# Patient Record
Sex: Female | Born: 1995 | Race: White | Hispanic: No | Marital: Single | Smoking: Current every day smoker
Health system: Southern US, Community
[De-identification: ages and names within clinical notes are randomized; demographics above are authoritative.]

## PROBLEM LIST (undated history)

## (undated) ENCOUNTER — Ambulatory Visit (HOSPITAL_COMMUNITY): Discharge status: Absent without leave | Payer: No Typology Code available for payment source

## (undated) HISTORY — PX: ANKLE SURGERY: SHX546

## (undated) HISTORY — PX: TONSILLECTOMY: SUR1361

---
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
## 2013-10-03 ENCOUNTER — Emergency Department (HOSPITAL_COMMUNITY)
Admission: EM | Admit: 2013-10-03 | Discharge: 2013-10-03 | Disposition: A | Discharge status: Home / Self Care | Payer: Medicaid Other | Attending: Emergency Medicine | Admitting: Emergency Medicine

## 2013-10-03 ENCOUNTER — Emergency Department (HOSPITAL_COMMUNITY): Payer: Medicaid Other

## 2013-10-03 ENCOUNTER — Encounter (HOSPITAL_COMMUNITY): Payer: Self-pay | Admitting: Emergency Medicine

## 2013-10-03 DIAGNOSIS — S161XXA Strain of muscle, fascia and tendon at neck level, initial encounter: Secondary | ICD-10-CM

## 2013-10-03 DIAGNOSIS — Y9389 Activity, other specified: Secondary | ICD-10-CM | POA: Insufficient documentation

## 2013-10-03 DIAGNOSIS — R55 Syncope and collapse: Secondary | ICD-10-CM | POA: Insufficient documentation

## 2013-10-03 DIAGNOSIS — F17200 Nicotine dependence, unspecified, uncomplicated: Secondary | ICD-10-CM | POA: Diagnosis not present

## 2013-10-03 DIAGNOSIS — Y92410 Unspecified street and highway as the place of occurrence of the external cause: Secondary | ICD-10-CM | POA: Insufficient documentation

## 2013-10-03 DIAGNOSIS — S139XXA Sprain of joints and ligaments of unspecified parts of neck, initial encounter: Secondary | ICD-10-CM | POA: Insufficient documentation

## 2013-10-03 DIAGNOSIS — IMO0002 Reserved for concepts with insufficient information to code with codable children: Secondary | ICD-10-CM | POA: Diagnosis present

## 2013-10-03 DIAGNOSIS — S060X1A Concussion with loss of consciousness of 30 minutes or less, initial encounter: Secondary | ICD-10-CM | POA: Diagnosis not present

## 2013-10-03 DIAGNOSIS — Z79899 Other long term (current) drug therapy: Secondary | ICD-10-CM | POA: Insufficient documentation

## 2013-10-03 DIAGNOSIS — S00411A Abrasion of right ear, initial encounter: Secondary | ICD-10-CM

## 2013-10-03 MED ORDER — OXYCODONE-ACETAMINOPHEN 5-325 MG PO TABS: 1.0000 | ORAL_TABLET | ORAL | Status: DC | PRN

## 2013-10-03 MED ORDER — TETANUS-DIPHTH-ACELL PERTUSSIS 5-2.5-18.5 LF-MCG/0.5 IM SUSP: 0.5000 mL | Freq: Once | INTRAMUSCULAR | Status: DC

## 2013-10-03 MED ORDER — OXYCODONE-ACETAMINOPHEN 5-325 MG PO TABS
2.0000 | ORAL_TABLET | Freq: Once | ORAL | Status: AC
  Administered 2013-10-03: 2 via ORAL
  Filled 2013-10-03: qty 2

## 2013-10-03 MED ORDER — MORPHINE SULFATE 4 MG/ML IJ SOLN
4.0000 mg | Freq: Once | INTRAMUSCULAR | Status: AC
  Administered 2013-10-03: 4 mg via INTRAMUSCULAR
  Filled 2013-10-03: qty 1
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
## 2013-10-03 NOTE — ED Notes (Signed)
Right ear cleaned. No lac found, pt has abrasions. EDP aware

## 2013-10-03 NOTE — Discharge Instructions (Signed)
You have neck pain, possibly from a cervical strain and/or pinched nerve.  ° °SEEK IMMEDIATE MEDICAL ATTENTION IF: °You develop difficulties swallowing or breathing.  °You have new or worse numbness, weakness, tingling, or movement problems in your arms or legs.  °You develop increasing pain which is uncontrolled with medications.  °You have change in bowel or bladder function, or other concerns. ° °You have had a head injury which does not appear to require admission at this time. A concussion is a state of changed mental ability from trauma. ° °SEEK IMMEDIATE MEDICAL ATTENTION IF: °There is confusion or drowsiness (although children frequently become drowsy after injury).  °You cannot awaken the injured person.  °There is nausea (feeling sick to your stomach) or continued, forceful vomiting.  °You notice dizziness or unsteadiness which is getting worse, or inability to walk.  °You have convulsions or unconsciousness.  °You experience severe, persistent headaches not relieved by Tylenol. (Do not take aspirin as this impairs clotting abilities). Take other pain medications only as directed.  °You cannot use arms or legs normally.  °There are changes in pupil sizes. (This is the black center in the colored part of the eye)  °There is clear or bloody discharge from the nose or ears.  °Change in speech, vision, swallowing, or understanding.  °Localized weakness, numbness, tingling, or change in bowel or bladder control. ° °

## 2013-10-12 ENCOUNTER — Encounter (HOSPITAL_COMMUNITY): Payer: Self-pay | Admitting: Emergency Medicine

## 2013-10-12 ENCOUNTER — Emergency Department (HOSPITAL_COMMUNITY)
Admission: EM | Admit: 2013-10-12 | Discharge: 2013-10-12 | Disposition: A | Discharge status: Home / Self Care | Payer: Medicaid Other | Attending: Emergency Medicine | Admitting: Emergency Medicine

## 2013-10-12 DIAGNOSIS — Z79899 Other long term (current) drug therapy: Secondary | ICD-10-CM | POA: Insufficient documentation

## 2013-10-12 DIAGNOSIS — R51 Headache: Secondary | ICD-10-CM | POA: Diagnosis present

## 2013-10-12 DIAGNOSIS — F0781 Postconcussional syndrome: Secondary | ICD-10-CM | POA: Diagnosis not present

## 2013-10-12 DIAGNOSIS — F17200 Nicotine dependence, unspecified, uncomplicated: Secondary | ICD-10-CM | POA: Insufficient documentation

## 2013-10-12 DIAGNOSIS — H9319 Tinnitus, unspecified ear: Secondary | ICD-10-CM | POA: Insufficient documentation

## 2013-10-12 MED ORDER — OXYCODONE-ACETAMINOPHEN 5-325 MG PO TABS: 1.0000 | ORAL_TABLET | ORAL | Status: AC | PRN

## 2013-10-12 MED ORDER — CYCLOBENZAPRINE HCL 5 MG PO TABS: 5.0000 mg | ORAL_TABLET | Freq: Three times a day (TID) | ORAL | Status: AC | PRN

## 2013-10-12 MED ORDER — IBUPROFEN 600 MG PO TABS: 600.0000 mg | ORAL_TABLET | Freq: Four times a day (QID) | ORAL | Status: AC | PRN
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
## 2013-10-12 NOTE — Discharge Instructions (Signed)
Concussion °A concussion, or closed-head injury, is a brain injury caused by a direct blow to the head or by a quick and sudden movement (jolt) of the head or neck. Concussions are usually not life-threatening. Even so, the effects of a concussion can be serious. If you have had a concussion before, you are more likely to experience concussion-like symptoms after a direct blow to the head.  °CAUSES °· Direct blow to the head, such as from running into another player during a soccer game, being hit in a fight, or hitting your head on a hard surface. °· A jolt of the head or neck that causes the brain to move back and forth inside the skull, such as in a car crash. °SIGNS AND SYMPTOMS °The signs of a concussion can be hard to notice. Early on, they may be missed by you, family members, and health care providers. You may look fine but act or feel differently. °Symptoms are usually temporary, but they may last for days, weeks, or even longer. Some symptoms may appear right away while others may not show up for hours or days. Every head injury is different. Symptoms include: °· Mild to moderate headaches that will not go away. °· A feeling of pressure inside your head. °· Having more trouble than usual: °¨ Learning or remembering things you have heard. °¨ Answering questions. °¨ Paying attention or concentrating. °¨ Organizing daily tasks. °¨ Making decisions and solving problems. °· Slowness in thinking, acting or reacting, speaking, or reading. °· Getting lost or being easily confused. °· Feeling tired all the time or lacking energy (fatigued). °· Feeling drowsy. °· Sleep disturbances. °¨ Sleeping more than usual. °¨ Sleeping less than usual. °¨ Trouble falling asleep. °¨ Trouble sleeping (insomnia). °· Loss of balance or feeling lightheaded or dizzy. °· Nausea or vomiting. °· Numbness or tingling. °· Increased sensitivity to: °¨ Sounds. °¨ Lights. °¨ Distractions. °· Vision problems or eyes that tire  easily. °· Diminished sense of taste or smell. °· Ringing in the ears. °· Mood changes such as feeling sad or anxious. °· Becoming easily irritated or angry for little or no reason. °· Lack of motivation. °· Seeing or hearing things other people do not see or hear (hallucinations). °DIAGNOSIS °Your health care provider can usually diagnose a concussion based on a description of your injury and symptoms. He or she will ask whether you passed out (lost consciousness) and whether you are having trouble remembering events that happened right before and during your injury. °Your evaluation might include: °· A brain scan to look for signs of injury to the brain. Even if the test shows no injury, you may still have a concussion. °· Blood tests to be sure other problems are not present. °TREATMENT °· Concussions are usually treated in an emergency department, in urgent care, or at a clinic. You may need to stay in the hospital overnight for further treatment. °· Tell your health care provider if you are taking any medicines, including prescription medicines, over-the-counter medicines, and natural remedies. Some medicines, such as blood thinners (anticoagulants) and aspirin, may increase the chance of complications. Also tell your health care provider whether you have had alcohol or are taking illegal drugs. This information may affect treatment. °· Your health care provider will send you home with important instructions to follow. °· How fast you will recover from a concussion depends on many factors. These factors include how severe your concussion is, what part of your brain was injured, your   age, and how healthy you were before the concussion. °· Most people with mild injuries recover fully. Recovery can take time. In general, recovery is slower in older persons. Also, persons who have had a concussion in the past or have other medical problems may find that it takes longer to recover from their current injury. °HOME  CARE INSTRUCTIONS °General Instructions °· Carefully follow the directions your health care provider gave you. °· Only take over-the-counter or prescription medicines for pain, discomfort, or fever as directed by your health care provider. °· Take only those medicines that your health care provider has approved. °· Do not drink alcohol until your health care provider says you are well enough to do so. Alcohol and certain other drugs may slow your recovery and can put you at risk of further injury. °· If it is harder than usual to remember things, write them down. °· If you are easily distracted, try to do one thing at a time. For example, do not try to watch TV while fixing dinner. °· Talk with family members or close friends when making important decisions. °· Keep all follow-up appointments. Repeated evaluation of your symptoms is recommended for your recovery. °· Watch your symptoms and tell others to do the same. Complications sometimes occur after a concussion. Older adults with a brain injury may have a higher risk of serious complications, such as a blood clot on the brain. °· Tell your teachers, school nurse, school counselor, coach, athletic trainer, or work manager about your injury, symptoms, and restrictions. Tell them about what you can or cannot do. They should watch for: °¨ Increased problems with attention or concentration. °¨ Increased difficulty remembering or learning new information. °¨ Increased time needed to complete tasks or assignments. °¨ Increased irritability or decreased ability to cope with stress. °¨ Increased symptoms. °· Rest. Rest helps the brain to heal. Make sure you: °¨ Get plenty of sleep at night. Avoid staying up late at night. °¨ Keep the same bedtime hours on weekends and weekdays. °¨ Rest during the day. Take daytime naps or rest breaks when you feel tired. °· Limit activities that require a lot of thought or concentration. These include: °¨ Doing homework or job-related  work. °¨ Watching TV. °¨ Working on the computer. °· Avoid any situation where there is potential for another head injury (football, hockey, soccer, basketball, martial arts, downhill snow sports and horseback riding). Your condition will get worse every time you experience a concussion. You should avoid these activities until you are evaluated by the appropriate follow-up health care providers. °Returning To Your Regular Activities °You will need to return to your normal activities slowly, not all at once. You must give your body and brain enough time for recovery. °· Do not return to sports or other athletic activities until your health care provider tells you it is safe to do so. °· Ask your health care provider when you can drive, ride a bicycle, or operate heavy machinery. Your ability to react may be slower after a brain injury. Never do these activities if you are dizzy. °· Ask your health care provider about when you can return to work or school. °Preventing Another Concussion °It is very important to avoid another brain injury, especially before you have recovered. In rare cases, another injury can lead to permanent brain damage, brain swelling, or death. The risk of this is greatest during the first 7-10 days after a head injury. Avoid injuries by: °· Wearing a seat   belt when riding in a car. °· Drinking alcohol only in moderation. °· Wearing a helmet when biking, skiing, skateboarding, skating, or doing similar activities. °· Avoiding activities that could lead to a second concussion, such as contact or recreational sports, until your health care provider says it is okay. °· Taking safety measures in your home. °¨ Remove clutter and tripping hazards from floors and stairways. °¨ Use grab bars in bathrooms and handrails by stairs. °¨ Place non-slip mats on floors and in bathtubs. °¨ Improve lighting in dim areas. °SEEK MEDICAL CARE IF: °· You have increased problems paying attention or  concentrating. °· You have increased difficulty remembering or learning new information. °· You need more time to complete tasks or assignments than before. °· You have increased irritability or decreased ability to cope with stress. °· You have more symptoms than before. °Seek medical care if you have any of the following symptoms for more than 2 weeks after your injury: °· Lasting (chronic) headaches. °· Dizziness or balance problems. °· Nausea. °· Vision problems. °· Increased sensitivity to noise or light. °· Depression or mood swings. °· Anxiety or irritability. °· Memory problems. °· Difficulty concentrating or paying attention. °· Sleep problems. °· Feeling tired all the time. °SEEK IMMEDIATE MEDICAL CARE IF: °· You have severe or worsening headaches. These may be a sign of a blood clot in the brain. °· You have weakness (even if only in one hand, leg, or part of the face). °· You have numbness. °· You have decreased coordination. °· You vomit repeatedly. °· You have increased sleepiness. °· One pupil is larger than the other. °· You have convulsions. °· You have slurred speech. °· You have increased confusion. This may be a sign of a blood clot in the brain. °· You have increased restlessness, agitation, or irritability. °· You are unable to recognize people or places. °· You have neck pain. °· It is difficult to wake you up. °· You have unusual behavior changes. °· You lose consciousness. °MAKE SURE YOU: °· Understand these instructions. °· Will watch your condition. °· Will get help right away if you are not doing well or get worse. °Document Released: 06/13/2003 Document Revised: 03/28/2013 Document Reviewed: 10/13/2012 °[employer]® Patient Information ©2015 [employer]. This information is not intended to replace advice given to you by your health care provider. Make sure you discuss any questions you have with your health care provider. ° °Concussion °A concussion, or closed-head injury, is a brain  injury caused by a direct blow to the head or by a quick and sudden movement (jolt) of the head or neck. Concussions are usually not life-threatening. Even so, the effects of a concussion can be serious. If you have had a concussion before, you are more likely to experience concussion-like symptoms after a direct blow to the head.  °CAUSES °· Direct blow to the head, such as from running into another player during a soccer game, being hit in a fight, or hitting your head on a hard surface. °· A jolt of the head or neck that causes the brain to move back and forth inside the skull, such as in a car crash. °SIGNS AND SYMPTOMS °The signs of a concussion can be hard to notice. Early on, they may be missed by you, family members, and health care providers. You may look fine but act or feel differently. °Symptoms are usually temporary, but they may last for days, weeks, or even longer. Some symptoms may appear right   away while others may not show up for hours or days. Every head injury is different. Symptoms include: °· Mild to moderate headaches that will not go away. °· A feeling of pressure inside your head. °· Having more trouble than usual: °¨ Learning or remembering things you have heard. °¨ Answering questions. °¨ Paying attention or concentrating. °¨ Organizing daily tasks. °¨ Making decisions and solving problems. °· Slowness in thinking, acting or reacting, speaking, or reading. °· Getting lost or being easily confused. °· Feeling tired all the time or lacking energy (fatigued). °· Feeling drowsy. °· Sleep disturbances. °¨ Sleeping more than usual. °¨ Sleeping less than usual. °¨ Trouble falling asleep. °¨ Trouble sleeping (insomnia). °· Loss of balance or feeling lightheaded or dizzy. °· Nausea or vomiting. °· Numbness or tingling. °· Increased sensitivity to: °¨ Sounds. °¨ Lights. °¨ Distractions. °· Vision problems or eyes that tire easily. °· Diminished sense of taste or smell. °· Ringing in the  ears. °· Mood changes such as feeling sad or anxious. °· Becoming easily irritated or angry for little or no reason. °· Lack of motivation. °· Seeing or hearing things other people do not see or hear (hallucinations). °DIAGNOSIS °Your health care provider can usually diagnose a concussion based on a description of your injury and symptoms. He or she will ask whether you passed out (lost consciousness) and whether you are having trouble remembering events that happened right before and during your injury. °Your evaluation might include: °· A brain scan to look for signs of injury to the brain. Even if the test shows no injury, you may still have a concussion. °· Blood tests to be sure other problems are not present. °TREATMENT °· Concussions are usually treated in an emergency department, in urgent care, or at a clinic. You may need to stay in the hospital overnight for further treatment. °· Tell your health care provider if you are taking any medicines, including prescription medicines, over-the-counter medicines, and natural remedies. Some medicines, such as blood thinners (anticoagulants) and aspirin, may increase the chance of complications. Also tell your health care provider whether you have had alcohol or are taking illegal drugs. This information may affect treatment. °· Your health care provider will send you home with important instructions to follow. °· How fast you will recover from a concussion depends on many factors. These factors include how severe your concussion is, what part of your brain was injured, your age, and how healthy you were before the concussion. °· Most people with mild injuries recover fully. Recovery can take time. In general, recovery is slower in older persons. Also, persons who have had a concussion in the past or have other medical problems may find that it takes longer to recover from their current injury. °HOME CARE INSTRUCTIONS °General Instructions °· Carefully follow the  directions your health care provider gave you. °· Only take over-the-counter or prescription medicines for pain, discomfort, or fever as directed by your health care provider. °· Take only those medicines that your health care provider has approved. °· Do not drink alcohol until your health care provider says you are well enough to do so. Alcohol and certain other drugs may slow your recovery and can put you at risk of further injury. °· If it is harder than usual to remember things, write them down. °· If you are easily distracted, try to do one thing at a time. For example, do not try to watch TV while fixing dinner. °· Talk with   family members or close friends when making important decisions. °· Keep all follow-up appointments. Repeated evaluation of your symptoms is recommended for your recovery. °· Watch your symptoms and tell others to do the same. Complications sometimes occur after a concussion. Older adults with a brain injury may have a higher risk of serious complications, such as a blood clot on the brain. °· Tell your teachers, school nurse, school counselor, coach, athletic trainer, or work manager about your injury, symptoms, and restrictions. Tell them about what you can or cannot do. They should watch for: °¨ Increased problems with attention or concentration. °¨ Increased difficulty remembering or learning new information. °¨ Increased time needed to complete tasks or assignments. °¨ Increased irritability or decreased ability to cope with stress. °¨ Increased symptoms. °· Rest. Rest helps the brain to heal. Make sure you: °¨ Get plenty of sleep at night. Avoid staying up late at night. °¨ Keep the same bedtime hours on weekends and weekdays. °¨ Rest during the day. Take daytime naps or rest breaks when you feel tired. °· Limit activities that require a lot of thought or concentration. These include: °¨ Doing homework or job-related work. °¨ Watching TV. °¨ Working on the computer. °· Avoid any  situation where there is potential for another head injury (football, hockey, soccer, basketball, martial arts, downhill snow sports and horseback riding). Your condition will get worse every time you experience a concussion. You should avoid these activities until you are evaluated by the appropriate follow-up health care providers. °Returning To Your Regular Activities °You will need to return to your normal activities slowly, not all at once. You must give your body and brain enough time for recovery. °· Do not return to sports or other athletic activities until your health care provider tells you it is safe to do so. °· Ask your health care provider when you can drive, ride a bicycle, or operate heavy machinery. Your ability to react may be slower after a brain injury. Never do these activities if you are dizzy. °· Ask your health care provider about when you can return to work or school. °Preventing Another Concussion °It is very important to avoid another brain injury, especially before you have recovered. In rare cases, another injury can lead to permanent brain damage, brain swelling, or death. The risk of this is greatest during the first 7-10 days after a head injury. Avoid injuries by: °· Wearing a seat belt when riding in a car. °· Drinking alcohol only in moderation. °· Wearing a helmet when biking, skiing, skateboarding, skating, or doing similar activities. °· Avoiding activities that could lead to a second concussion, such as contact or recreational sports, until your health care provider says it is okay. °· Taking safety measures in your home. °¨ Remove clutter and tripping hazards from floors and stairways. °¨ Use grab bars in bathrooms and handrails by stairs. °¨ Place non-slip mats on floors and in bathtubs. °¨ Improve lighting in dim areas. °SEEK MEDICAL CARE IF: °· You have increased problems paying attention or concentrating. °· You have increased difficulty remembering or learning new  information. °· You need more time to complete tasks or assignments than before. °· You have increased irritability or decreased ability to cope with stress. °· You have more symptoms than before. °Seek medical care if you have any of the following symptoms for more than 2 weeks after your injury: °· Lasting (chronic) headaches. °· Dizziness or balance problems. °· Nausea. °· Vision problems. °·   Increased sensitivity to noise or light. °· Depression or mood swings. °· Anxiety or irritability. °· Memory problems. °· Difficulty concentrating or paying attention. °· Sleep problems. °· Feeling tired all the time. °SEEK IMMEDIATE MEDICAL CARE IF: °· You have severe or worsening headaches. These may be a sign of a blood clot in the brain. °· You have weakness (even if only in one hand, leg, or part of the face). °· You have numbness. °· You have decreased coordination. °· You vomit repeatedly. °· You have increased sleepiness. °· One pupil is larger than the other. °· You have convulsions. °· You have slurred speech. °· You have increased confusion. This may be a sign of a blood clot in the brain. °· You have increased restlessness, agitation, or irritability. °· You are unable to recognize people or places. °· You have neck pain. °· It is difficult to wake you up. °· You have unusual behavior changes. °· You lose consciousness. °MAKE SURE YOU: °· Understand these instructions. °· Will watch your condition. °· Will get help right away if you are not doing well or get worse. °Document Released: 06/13/2003 Document Revised: 03/28/2013 Document Reviewed: 10/13/2012 °[employer]® Patient Information ©2015 [employer]. This information is not intended to replace advice given to you by your health care provider. Make sure you discuss any questions you have with your health care provider. ° °
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
## 2013-10-15 NOTE — ED Provider Notes (Signed)
Medical screening examination/treatment/procedure(s) were performed by non-physician practitioner and as supervising physician I was immediately available for consultation/collaboration.   EKG Interpretation None       Pinki Rottman, MD 10/15/13 0729 

## 2013-11-01 ENCOUNTER — Ambulatory Visit: Payer: Medicaid Other | Admitting: Neurology

## 2014-04-17 ENCOUNTER — Encounter (HOSPITAL_COMMUNITY): Payer: Self-pay | Admitting: Cardiology

## 2014-04-17 ENCOUNTER — Emergency Department (HOSPITAL_COMMUNITY)
Admission: EM | Admit: 2014-04-17 | Discharge: 2014-04-17 | Disposition: A | Discharge status: Home / Self Care | Payer: Medicaid Other | Attending: Emergency Medicine | Admitting: Emergency Medicine

## 2014-04-17 ENCOUNTER — Emergency Department (HOSPITAL_COMMUNITY): Payer: Medicaid Other

## 2014-04-17 DIAGNOSIS — M542 Cervicalgia: Secondary | ICD-10-CM

## 2014-04-17 DIAGNOSIS — S199XXA Unspecified injury of neck, initial encounter: Secondary | ICD-10-CM | POA: Diagnosis present

## 2014-04-17 DIAGNOSIS — Y92410 Unspecified street and highway as the place of occurrence of the external cause: Secondary | ICD-10-CM | POA: Diagnosis not present

## 2014-04-17 DIAGNOSIS — Z720 Tobacco use: Secondary | ICD-10-CM | POA: Insufficient documentation

## 2014-04-17 DIAGNOSIS — Y998 Other external cause status: Secondary | ICD-10-CM | POA: Insufficient documentation

## 2014-04-17 DIAGNOSIS — S0990XA Unspecified injury of head, initial encounter: Secondary | ICD-10-CM | POA: Insufficient documentation

## 2014-04-17 DIAGNOSIS — Y9389 Activity, other specified: Secondary | ICD-10-CM | POA: Diagnosis not present

## 2014-04-17 MED ORDER — IBUPROFEN 800 MG PO TABS
800.0000 mg | ORAL_TABLET | Freq: Once | ORAL | Status: AC
  Administered 2014-04-17: 800 mg via ORAL
  Filled 2014-04-17: qty 1
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
## 2014-04-17 NOTE — Discharge Instructions (Signed)
Take ibuprofen and Tylenol for pain every 6 hours as needed. Use ice as needed.  If you were given medicines take as directed.  If you are on coumadin or contraceptives realize their levels and effectiveness is altered by many different medicines.  If you have any reaction (rash, tongues swelling, other) to the medicines stop taking and see a physician.   Please follow up as directed and return to the ER or see a physician for new or worsening symptoms.  Thank you. Filed Vitals:   04/17/14 1515  BP: 129/76  Pulse: 100  Temp: 98.6 F (37 C)  TempSrc: Oral  Resp: 18  Height: 5\' 7"  (1.702 m)  Weight: 240 lb (108.863 kg)  SpO2: 100%

## 2014-04-17 NOTE — ED Notes (Signed)
Attempted to call pt to inform pt of xray results but unable to leave message due to voice mailbox if full
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
## 2019-02-10 ENCOUNTER — Telehealth: Payer: Self-pay | Admitting: Orthopaedic Surgery

## 2019-02-10 NOTE — Telephone Encounter (Signed)
Received voicemail message from Palmer with [facility] advised Dr Erlinda Hong ordered wheelchair for the patient. Lattie Haw said she faxed a [place]  CMN for the wheelchair and will need to have it signed, dated and faxed back. The fax# is [phone]   The phone # is [phone]

## 2019-02-10 NOTE — Telephone Encounter (Signed)
LM for them that this is not a patient we have seen before and if we possibly have the wrong patient and to call me back if needed

## 2019-02-10 NOTE — Telephone Encounter (Signed)
Have you seen this by chance? 

## 2019-02-10 NOTE — Telephone Encounter (Signed)
Not my patient.  No record in epic chart
